# Patient Record
Sex: Female | Born: 1990 | Race: Black or African American | Hispanic: No | Marital: Single | Smoking: Never smoker
Health system: Southern US, Community
[De-identification: ages and names within clinical notes are randomized; demographics above are authoritative.]

---

## 2017-01-21 DIAGNOSIS — N390 Urinary tract infection, site not specified: Secondary | ICD-10-CM | POA: Diagnosis not present

## 2017-01-21 DIAGNOSIS — R1030 Lower abdominal pain, unspecified: Secondary | ICD-10-CM | POA: Diagnosis not present

## 2017-01-21 DIAGNOSIS — N898 Other specified noninflammatory disorders of vagina: Secondary | ICD-10-CM | POA: Diagnosis not present

## 2017-01-26 ENCOUNTER — Emergency Department (HOSPITAL_COMMUNITY)
Admission: EM | Admit: 2017-01-26 | Discharge: 2017-01-26 | Disposition: A | Discharge status: Home / Self Care | Payer: Self-pay | Attending: Emergency Medicine | Admitting: Emergency Medicine

## 2017-01-26 ENCOUNTER — Encounter (HOSPITAL_COMMUNITY): Payer: Self-pay | Admitting: Emergency Medicine

## 2017-01-26 ENCOUNTER — Emergency Department (HOSPITAL_COMMUNITY): Payer: Self-pay

## 2017-01-26 DIAGNOSIS — N920 Excessive and frequent menstruation with regular cycle: Secondary | ICD-10-CM | POA: Insufficient documentation

## 2017-01-26 LAB — URINALYSIS, ROUTINE W REFLEX MICROSCOPIC
BILIRUBIN URINE: NEGATIVE
Bacteria, UA: NONE SEEN
Glucose, UA: NEGATIVE mg/dL
KETONES UR: NEGATIVE mg/dL
LEUKOCYTES UA: NEGATIVE
NITRITE: NEGATIVE
PROTEIN: NEGATIVE mg/dL
Specific Gravity, Urine: 1.018 (ref 1.005–1.030)
Squamous Epithelial / LPF: NONE SEEN
pH: 7 (ref 5.0–8.0)

## 2017-01-26 LAB — CBC
HEMATOCRIT: 29.9 % — AB (ref 36.0–46.0)
HEMOGLOBIN: 8.8 g/dL — AB (ref 12.0–15.0)
MCH: 19.2 pg — ABNORMAL LOW (ref 26.0–34.0)
MCHC: 29.4 g/dL — ABNORMAL LOW (ref 30.0–36.0)
MCV: 65.3 fL — ABNORMAL LOW (ref 78.0–100.0)
Platelets: 314 10*3/uL (ref 150–400)
RBC: 4.58 MIL/uL (ref 3.87–5.11)
RDW: 16.7 % — ABNORMAL HIGH (ref 11.5–15.5)
WBC: 9.7 10*3/uL (ref 4.0–10.5)

## 2017-01-26 LAB — COMPREHENSIVE METABOLIC PANEL
ALT: 98 U/L — ABNORMAL HIGH (ref 14–54)
ANION GAP: 5 (ref 5–15)
AST: 54 U/L — ABNORMAL HIGH (ref 15–41)
Albumin: 3.3 g/dL — ABNORMAL LOW (ref 3.5–5.0)
Alkaline Phosphatase: 79 U/L (ref 38–126)
BILIRUBIN TOTAL: 0.6 mg/dL (ref 0.3–1.2)
BUN: 6 mg/dL (ref 6–20)
CHLORIDE: 110 mmol/L (ref 101–111)
CO2: 24 mmol/L (ref 22–32)
Calcium: 8.7 mg/dL — ABNORMAL LOW (ref 8.9–10.3)
Creatinine, Ser: 0.6 mg/dL (ref 0.44–1.00)
GFR calc Af Amer: >60 mL/min (ref >60)
Glucose, Bld: 89 mg/dL (ref 65–99)
POTASSIUM: 3.5 mmol/L (ref 3.5–5.1)
Sodium: 139 mmol/L (ref 135–145)
TOTAL PROTEIN: 7.2 g/dL (ref 6.5–8.1)

## 2017-01-26 LAB — WET PREP, GENITAL
Clue Cells Wet Prep HPF POC: NONE SEEN
SPERM: NONE SEEN
TRICH WET PREP: NONE SEEN
Yeast Wet Prep HPF POC: NONE SEEN

## 2017-01-26 LAB — I-STAT BETA HCG BLOOD, ED (MC, WL, AP ONLY): I-stat hCG, quantitative: <5 m[IU]/mL (ref <5)

## 2017-01-26 LAB — POC URINE PREG, ED: PREG TEST UR: NEGATIVE

## 2017-01-26 LAB — LIPASE, BLOOD: LIPASE: 18 U/L (ref 11–51)

## 2017-01-26 MED ORDER — ACETAMINOPHEN 325 MG PO TABS
650.0000 mg | ORAL_TABLET | Freq: Once | ORAL | Status: AC
  Administered 2017-01-26: 650 mg via ORAL
  Filled 2017-01-26: qty 2

## 2017-01-26 MED ORDER — KETOROLAC TROMETHAMINE 30 MG/ML IJ SOLN
30.0000 mg | Freq: Once | INTRAMUSCULAR | Status: AC
  Administered 2017-01-26: 30 mg via INTRAVENOUS
  Filled 2017-01-26: qty 1

## 2017-01-26 MED ORDER — SODIUM CHLORIDE 0.9 % IV BOLUS (SEPSIS)
1000.0000 mL | Freq: Once | INTRAVENOUS | Status: AC
  Administered 2017-01-26: 1000 mL via INTRAVENOUS

## 2017-01-26 NOTE — Discharge Instructions (Signed)
Please read and follow all provided instructions.  Your diagnoses today include:  1. Menorrhagia with regular cycle     Tests performed today include: Vital signs. See below for your results today.   Medications prescribed:  Take as prescribed   Home care instructions:  Follow any educational materials contained in this packet.  Follow-up instructions: Please follow-up with your primary care provider for further evaluation of symptoms and treatment   Return instructions:  Please return to the Emergency Department if you do not get better, if you get worse, or new symptoms OR  - Fever (temperature greater than 101.46F)  - Bleeding that does not stop with holding pressure to the area    -Severe pain (please note that you may be more sore the day after your accident)  - Chest Pain  - Difficulty breathing  - Severe nausea or vomiting  - Inability to tolerate food and liquids  - Passing out  - Skin becoming red around your wounds  - Change in mental status (confusion or lethargy)  - New numbness or weakness    Please return if you have any other emergent concerns.  Additional Information:  Your vital signs today were: BP (!) 143/99 (BP Location: Right Arm)    Pulse 94    Temp 98.3 F (36.8 C) (Oral)    Resp 17    SpO2 99%  If your blood pressure (BP) was elevated above 135/85 this visit, please have this repeated by your doctor within one month. ---------------

## 2017-01-26 NOTE — ED Notes (Signed)
RN is starting IV and collecting blood work at this time

## 2017-01-26 NOTE — ED Provider Notes (Signed)
[facility]-EMERGENCY DEPT Provider Note   CSN: [id number] Arrival date & time: 01/26/17  1230  History   Chief Complaint Chief Complaint  Patient presents with  . Abdominal Pain  . Hematuria    HPI Deborah Leblanc is a 26 y.o. female.  HPI  26 y.o. female, presents to the Emergency Department today complaining of lower abdominal pain. Hematuria noted as well with passage of clots in urine. Seen in ED for same in [place] last Sunday. Diagnosed with UTI and given Macrobid. Notes symptoms never really resolved and started to feel right lower back pain. No N/V. No chills. No fever. No dysuria. No CP/SOB. Took Motrin PTA. Notes pain 8/10. No vaginal bleeding or discharge. LMP last month. Pt states she is due for cycle. No other symptoms noted.   History reviewed. No pertinent past medical history.  There are no active problems to display for this patient.   History reviewed. No pertinent surgical history.  OB History    No data available       Home Medications    Prior to Admission medications   Not on File    Family History No family history on file.  Social History Social History  Substance Use Topics  . Smoking status: Never Smoker  . Smokeless tobacco: Never Used  . Alcohol use No     Allergies   Patient has no allergy information on record.   Review of Systems Review of Systems ROS reviewed and all are negative for acute change except as noted in the HPI.  Physical Exam Updated Vital Signs BP (!) 143/99 (BP Location: Right Arm)   Pulse 94   Temp 98.3 F (36.8 C) (Oral)   Resp 17   SpO2 99%   Physical Exam  Constitutional: She is oriented to person, place, and time. Vital signs are normal. She appears well-developed and well-nourished.  HENT:  Head: Normocephalic and atraumatic.  Right Ear: Hearing normal.  Left Ear: Hearing normal.  Eyes: Conjunctivae and EOM are normal. Pupils are equal, round, and reactive to light.  Neck: Normal range of motion.  Neck supple.  Cardiovascular: Normal rate, regular rhythm, normal heart sounds and intact distal pulses.   Pulmonary/Chest: Effort normal and breath sounds normal. No respiratory distress. She has no wheezes. She has no rales. She exhibits no tenderness.  Abdominal: Soft. There is no tenderness. There is no rigidity, no rebound, no guarding, no CVA tenderness, no tenderness at McBurney's point and negative Murphy's sign.  Musculoskeletal: Normal range of motion.  Neurological: She is alert and oriented to person, place, and time.  Skin: Skin is warm and dry.  Psychiatric: She has a normal mood and affect. Her speech is normal and behavior is normal. Thought content normal.  Nursing note and vitals reviewed.  Exam performed by Eston Estersyler M Majid Mccravy,  exam chaperoned Date: 01/26/2017 Pelvic exam: normal external genitalia without evidence of trauma. VULVA: normal appearing vulva with no masses, tenderness or lesion. VAGINA: normal appearing vagina with normal color and discharge, no lesions. CERVIX: normal appearing cervix without lesions, cervical motion tenderness absent, cervical os closed with out purulent discharge; vaginal discharge - bloody, Wet prep and DNA probe for chlamydia and GC obtained.   ADNEXA: normal adnexa in size, nontender and no masses UTERUS: uterus is normal size, shape, consistency and nontender.    ED Treatments / Results  Labs (all labs ordered are listed, but only abnormal results are displayed) Labs Reviewed  COMPREHENSIVE METABOLIC PANEL - Abnormal; Notable for  the following:       Result Value   Calcium 8.7 (*)    Albumin 3.3 (*)    AST 54 (*)    ALT 98 (*)    All other components within normal limits  CBC - Abnormal; Notable for the following:    Hemoglobin 8.8 (*)    HCT 29.9 (*)    MCV 65.3 (*)    MCH 19.2 (*)    MCHC 29.4 (*)    RDW 16.7 (*)    All other components within normal limits  URINALYSIS, ROUTINE W REFLEX MICROSCOPIC - Abnormal; Notable for the  following:    Hgb urine dipstick LARGE (*)    All other components within normal limits  WET PREP, GENITAL  LIPASE, BLOOD  POC URINE PREG, ED  I-STAT BETA HCG BLOOD, ED ([facility], [facility], [facility] ONLY)  GC/CHLAMYDIA PROBE AMP ([place]) NOT AT [facility]   EKG  EKG Interpretation None       Radiology Ct Renal Stone Study  Result Date: 01/26/2017 CLINICAL DATA:  Lower abdominal pain and hematuria. EXAM: CT ABDOMEN AND PELVIS WITHOUT CONTRAST TECHNIQUE: Multidetector CT imaging of the abdomen and pelvis was performed following the standard protocol without IV contrast. COMPARISON:  None. FINDINGS: Lower chest: Lung bases are normal. Hepatobiliary: Within normal. Pancreas: Within normal. Spleen: Within normal. Adrenals/Urinary Tract: Adrenal glands are normal. Kidneys normal in size without hydronephrosis or nephrolithiasis. Ureters and bladder are normal. Stomach/Bowel: Stomach and small bowel are unremarkable. Appendix is normal. Colon is unremarkable. Vascular/Lymphatic: Within normal. Reproductive: Within normal. Other: Possible tiny amount of right pelvic fluid likely physiologic. Musculoskeletal: Within normal. IMPRESSION: No acute findings in the abdomen/pelvis. Electronically Signed   By: Elberta Fortis M.D.   On: 01/26/2017 15:17    Procedures Procedures (including critical care time)  Medications Ordered in ED Medications - No data to display   Initial Impression / Assessment and Plan / ED Course  I have reviewed the triage vital signs and the nursing notes.  Pertinent labs & imaging results that were available during my care of the patient were reviewed by me and considered in my medical decision making (see chart for details).  Final Clinical Impressions(s) / ED Diagnoses  {I have reviewed and evaluated the relevant laboratory values.   {I have reviewed the relevant previous healthcare records.  {I obtained HPI from historian.   ED Course:  Assessment: Pt is a 25yF with hematuria x 2  weeks. Seen on Sunday in [place] ED with Dx UTI. Given Macrobid. Here for same without improvement. No fevers. Noted new right lower back pain. No N/V. No chills. On exam, pt in NAD. VSS. Normotensive. Afebrile. Lungs CTA, Heart RRR, mild CVA tenderness, and denies N/V. CBC with no leukocytosis. Hgb 8.8 without previous to compare. VSS. Creatinine WNL. UA without signs of infection. Noted hematuria. Pt again denies vaginal bleeding. CT renal ordered due to low back pain, which was negative. Due to negative work up. Pelvic was performed and showed vaginal bleeding from cervix. No adnexal or CMT. GC obtained. Wet prep obtained. Will DC patient home with follow up to GYN to establish care. Continue ABX from other ED. Strict return precautions given. At time of discharge, Patient is in no acute distress. Vital Signs are stable. Patient is able to ambulate. Patient able to tolerate PO.   Disposition/Plan:  DC Home Additional Verbal discharge instructions given and discussed with patient.  Pt Instructed to f/u with PCP in the next week  for evaluation and treatment of symptoms. Return precautions given Pt acknowledges and agrees with plan  Supervising Physician Mancel Bale, MD  Final diagnoses:  Menorrhagia with regular cycle    New Prescriptions New Prescriptions   No medications on file     Audry Pili, PA-C 01/26/17 1529    Audry Pili, PA-C 01/26/17 1531    Mancel Bale, MD 01/26/17 1557

## 2017-01-26 NOTE — ED Triage Notes (Signed)
Patient here with complaints of lower abd pain. Reports blood in urine. States that she was seen for the same in the ER in [place]. Denies pregnancy.

## 2017-01-28 LAB — GC/CHLAMYDIA PROBE AMP (~~LOC~~) NOT AT ARMC
CHLAMYDIA, DNA PROBE: POSITIVE — AB
NEISSERIA GONORRHEA: NEGATIVE

## 2017-03-13 DIAGNOSIS — R5383 Other fatigue: Secondary | ICD-10-CM | POA: Diagnosis not present

## 2017-03-28 DIAGNOSIS — D509 Iron deficiency anemia, unspecified: Secondary | ICD-10-CM | POA: Diagnosis not present

## 2017-03-28 DIAGNOSIS — E0590 Thyrotoxicosis, unspecified without thyrotoxic crisis or storm: Secondary | ICD-10-CM | POA: Diagnosis not present

## 2017-03-29 ENCOUNTER — Other Ambulatory Visit (HOSPITAL_COMMUNITY): Payer: Self-pay | Admitting: Family Medicine

## 2017-03-29 DIAGNOSIS — E0590 Thyrotoxicosis, unspecified without thyrotoxic crisis or storm: Secondary | ICD-10-CM

## 2017-04-03 ENCOUNTER — Encounter (HOSPITAL_COMMUNITY)
Admission: RE | Admit: 2017-04-03 | Discharge: 2017-04-03 | Disposition: A | Discharge status: Home / Self Care | Payer: 59 | Source: Ambulatory Visit | Attending: Family Medicine | Admitting: Family Medicine

## 2017-04-03 DIAGNOSIS — E0590 Thyrotoxicosis, unspecified without thyrotoxic crisis or storm: Secondary | ICD-10-CM | POA: Insufficient documentation

## 2017-04-03 MED ORDER — SODIUM IODIDE I 131 CAPSULE
14.7000 | Freq: Once | INTRAVENOUS | Status: AC | PRN
Start: 2017-04-03 — End: 2017-04-03
  Administered 2017-04-03: 14.7 via ORAL

## 2017-04-04 ENCOUNTER — Encounter (HOSPITAL_COMMUNITY)
Admission: RE | Admit: 2017-04-04 | Discharge: 2017-04-04 | Disposition: A | Discharge status: Home / Self Care | Payer: 59 | Source: Ambulatory Visit | Attending: Family Medicine | Admitting: Family Medicine

## 2017-04-04 DIAGNOSIS — E0500 Thyrotoxicosis with diffuse goiter without thyrotoxic crisis or storm: Secondary | ICD-10-CM | POA: Diagnosis not present

## 2017-04-04 DIAGNOSIS — E0590 Thyrotoxicosis, unspecified without thyrotoxic crisis or storm: Secondary | ICD-10-CM | POA: Diagnosis not present

## 2017-04-04 MED ORDER — SODIUM PERTECHNETATE TC 99M INJECTION
10.1000 | Freq: Once | INTRAVENOUS | Status: AC | PRN
  Administered 2017-04-04: 10.1 via INTRAVENOUS

## 2017-04-10 DIAGNOSIS — L810 Postinflammatory hyperpigmentation: Secondary | ICD-10-CM | POA: Diagnosis not present

## 2017-04-10 DIAGNOSIS — L700 Acne vulgaris: Secondary | ICD-10-CM | POA: Diagnosis not present

## 2018-04-09 IMAGING — CT CT RENAL STONE PROTOCOL
2 of 3 series · 17 of 46 positions shown, 19 images · non-contrast
Comparison: None.

CLINICAL DATA: Lower abdominal pain and hematuria.

EXAM:
CT ABDOMEN AND PELVIS WITHOUT CONTRAST
TECHNIQUE: Multidetector CT imaging of the abdomen and pelvis was performed
following the standard protocol without IV contrast.

[Series 3: coronal · coronal · 0.74mm/px · 3 of 191 slices shown]
[im 64/191  soft-tissue]
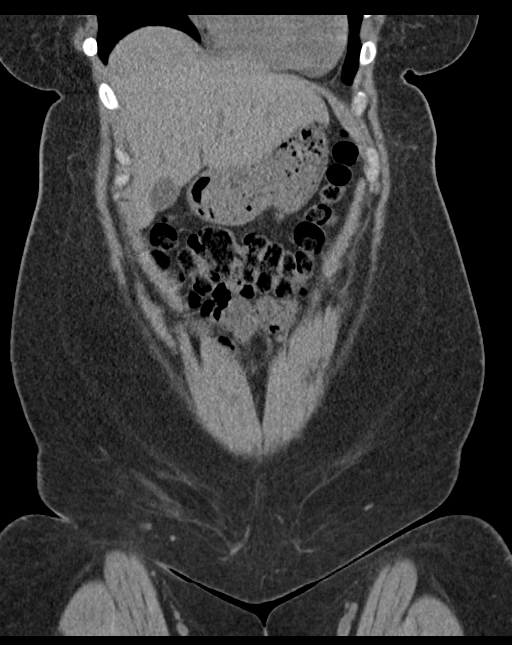
[im 85/191  soft-tissue]
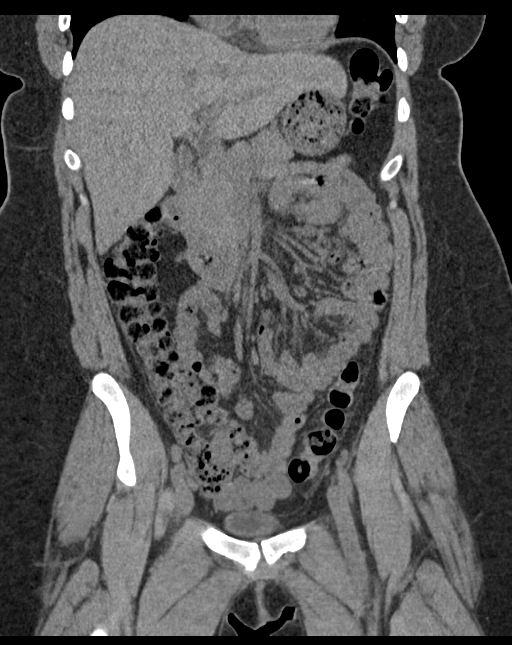
[im 106/191  soft-tissue]
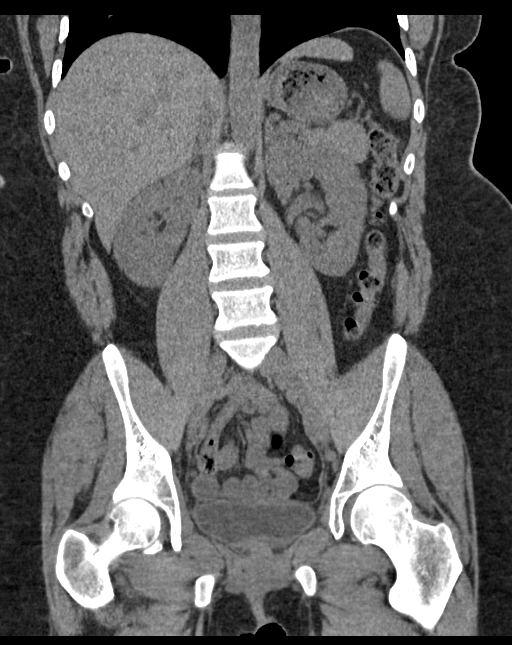

[Series 6: lung · axial · 0.82mm/px · z∈[+726,+828]mm · 14 of 59 slices shown, 16 images]
[im 4/59  soft-tissue]
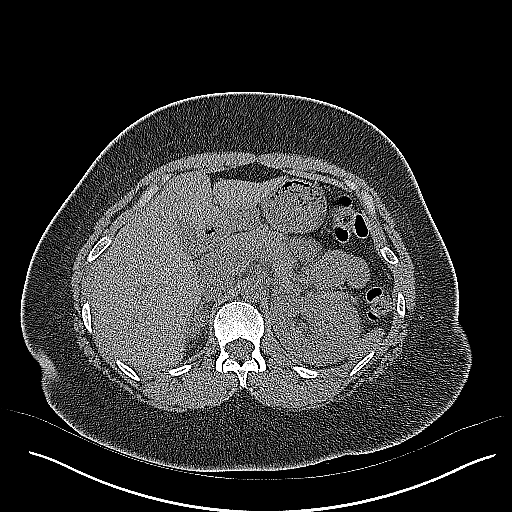
[im 4/59  bone]
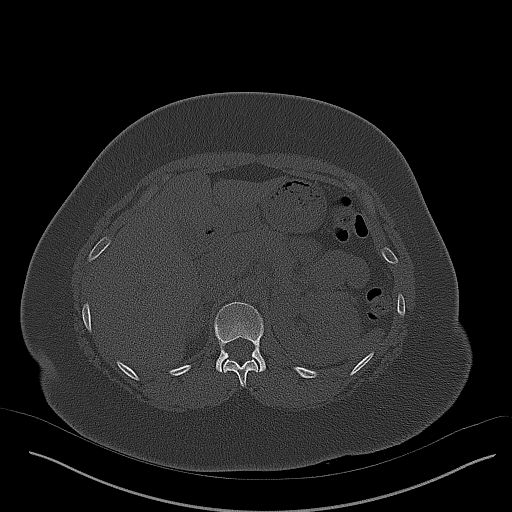
[im 8/59  soft-tissue]
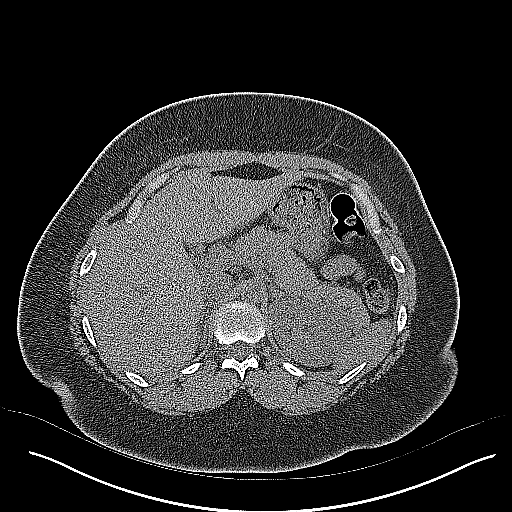
[im 12/59  soft-tissue]
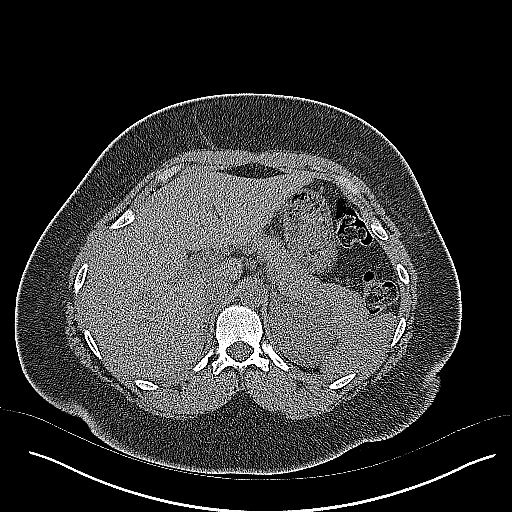
[im 15/59  soft-tissue]
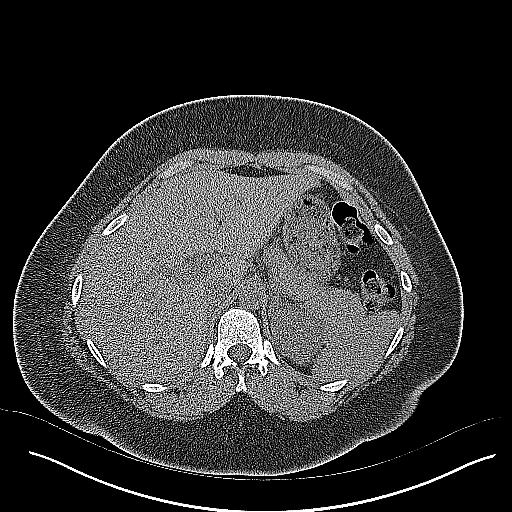
[im 19/59  soft-tissue]
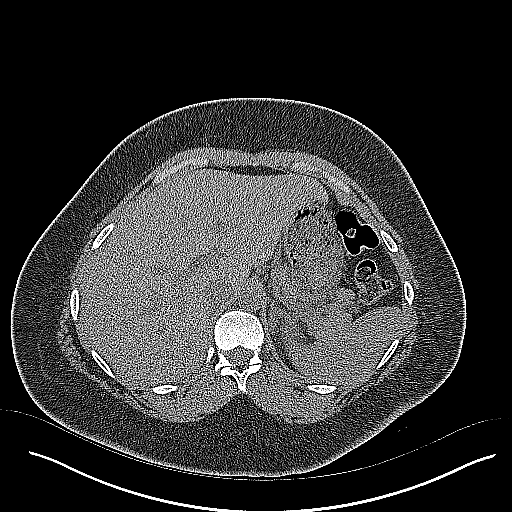
[im 23/59  soft-tissue]
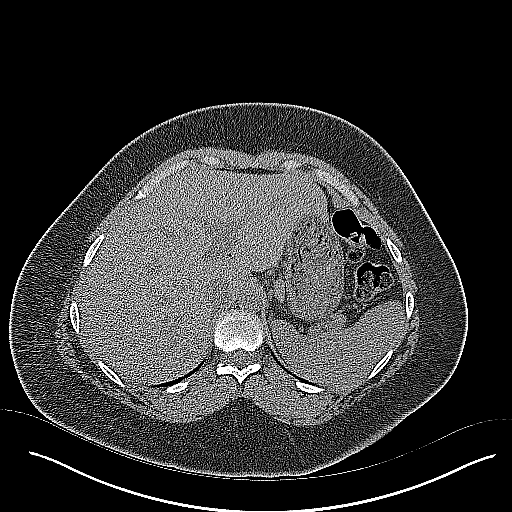
[im 27/59  soft-tissue]
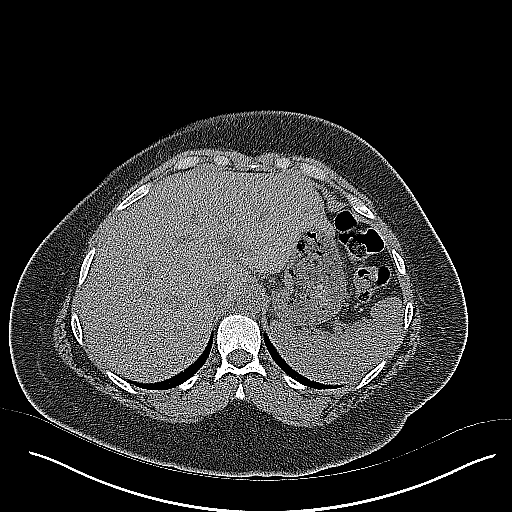
[im 32/59  soft-tissue]
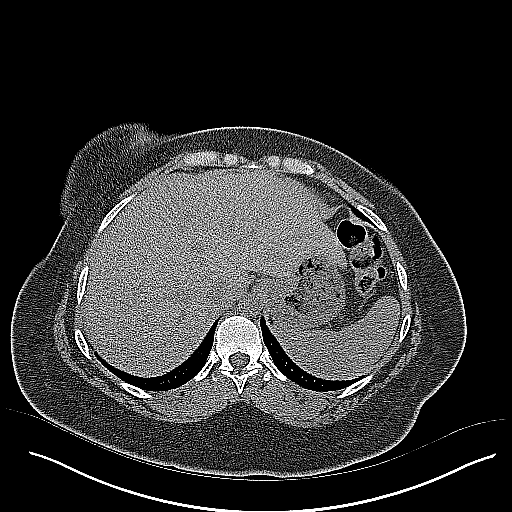
[im 36/59  soft-tissue]
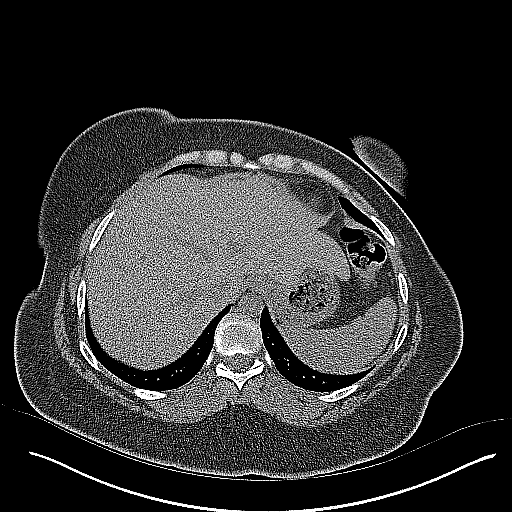
[im 36/59  bone]
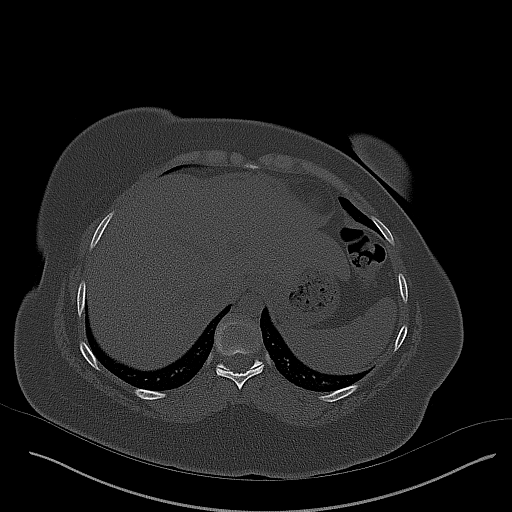
[im 40/59  soft-tissue]
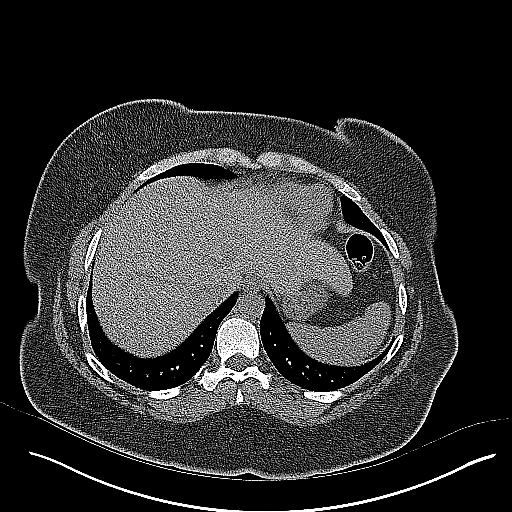
[im 44/59  soft-tissue]
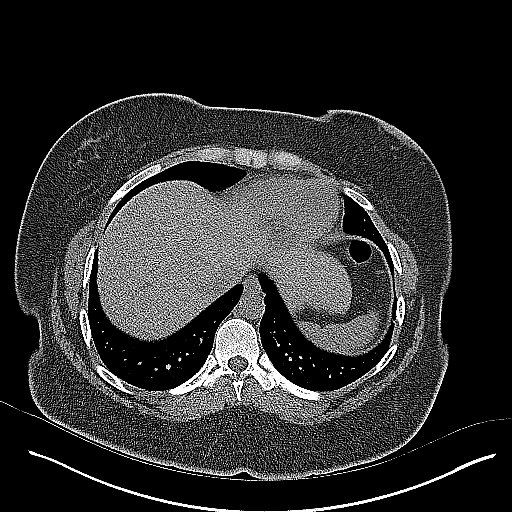
[im 47/59  soft-tissue]
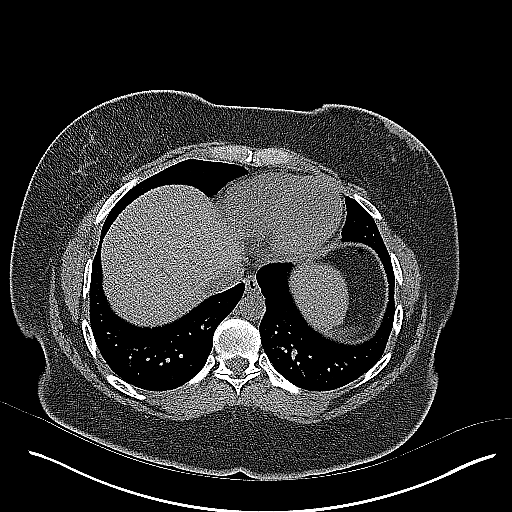
[im 51/59  soft-tissue]
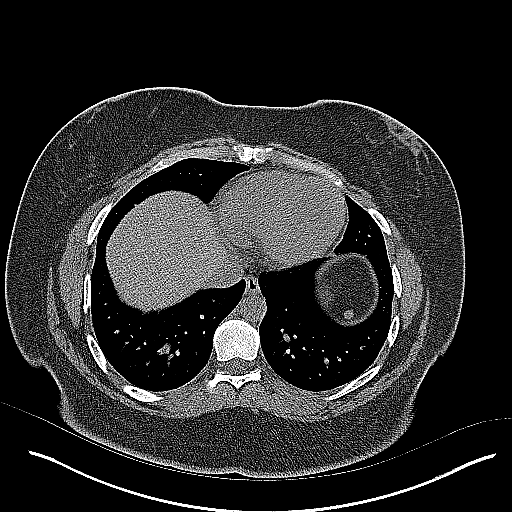
[im 55/59  soft-tissue]
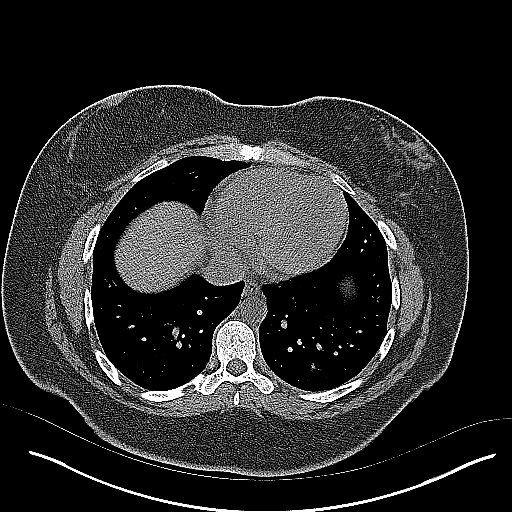

[17 of 46 positions shown; findings below may reference images not displayed]

FINDINGS: Lower chest: Lung bases are normal.

Hepatobiliary: Within normal.

Pancreas: Within normal.

Spleen: Within normal.

Adrenals/Urinary Tract: Adrenal glands are normal. Kidneys normal in
size without hydronephrosis or nephrolithiasis. Ureters and bladder
are normal.

Stomach/Bowel: Stomach and small bowel are unremarkable. Appendix is
normal. Colon is unremarkable.

Vascular/Lymphatic: Within normal.

Reproductive: Within normal.

Other: Possible tiny amount of right pelvic fluid likely
physiologic.

Musculoskeletal: Within normal.
IMPRESSION: No acute findings in the abdomen/pelvis.
